# Patient Record
Sex: Male | Born: 1990 | Race: Black or African American | Hispanic: No | Marital: Single | Smoking: Never smoker
Health system: Southern US, Community
[De-identification: ages and names within clinical notes are randomized; demographics above are authoritative.]

---

## 2012-12-01 ENCOUNTER — Ambulatory Visit: Payer: BC Managed Care – PPO

## 2012-12-01 ENCOUNTER — Ambulatory Visit: Payer: BC Managed Care – PPO | Admitting: Family Medicine

## 2012-12-01 VITALS — BP 111/70 | HR 74 | Temp 98.0°F | Resp 16 | Ht 71.5 in | Wt 153.0 lb

## 2012-12-01 DIAGNOSIS — M25532 Pain in left wrist: Secondary | ICD-10-CM

## 2012-12-01 DIAGNOSIS — S63502A Unspecified sprain of left wrist, initial encounter: Secondary | ICD-10-CM

## 2012-12-01 DIAGNOSIS — M25539 Pain in unspecified wrist: Secondary | ICD-10-CM

## 2012-12-01 DIAGNOSIS — R369 Urethral discharge, unspecified: Secondary | ICD-10-CM

## 2012-12-01 DIAGNOSIS — S63509A Unspecified sprain of unspecified wrist, initial encounter: Secondary | ICD-10-CM

## 2012-12-01 LAB — POCT UA - MICROSCOPIC ONLY
Bacteria, U Microscopic: NEGATIVE
Epithelial cells, urine per micros: NEGATIVE
Mucus, UA: NEGATIVE
Yeast, UA: NEGATIVE

## 2012-12-01 LAB — POCT URINALYSIS DIPSTICK
Bilirubin, UA: NEGATIVE
Blood, UA: NEGATIVE
Glucose, UA: NEGATIVE
Ketones, UA: NEGATIVE
Spec Grav, UA: 1.02
pH, UA: 6.5

## 2012-12-01 NOTE — Progress Notes (Signed)
[address]   [place], [place]  [address]   [phone]  Subjective:    Patient ID: Donald Brewer, male    DOB: [birth year], 22 y.o.   MRN: [id number]  HPI This 22 y.o. male presents for evaluation of the following:  1.  L wrist pain: sprained wrist four weeks ago.  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  Swelling in wrist initially; icing; then exercises.  Unable to do xray.  [place] wrist; pain midline.  No n/t/w.    2.  Penile discharge: discharge in urine. Onset one week ago.   No dysuria, +urgency; no hematuria.  No testicular swelling.  +sexual activity; no STD hx; last STD screening several 2 years ago.  Total partners = 11.  No nausea or vomiting. No new partners; same partner x 1 years.  PCP: none  Graduated from [employer]; started working in [place] for [employer].   Review of Systems  Constitutional: Negative for fever, chills, diaphoresis and fatigue.  Gastrointestinal: Negative for nausea, vomiting and abdominal pain.  Genitourinary: Positive for frequency and discharge. Negative for urgency, flank pain, penile swelling, scrotal swelling, genital sores, penile pain and testicular pain.  Musculoskeletal: Positive for myalgias and arthralgias. Negative for joint swelling.  Skin: Negative for wound.  Neurological: Negative for weakness and numbness.    History reviewed. No pertinent past medical history.  History reviewed. No pertinent past surgical history.  Prior to Admission medications   Not on File    No Known Allergies  History   Social History  . Marital Status: Single    Spouse Name: N/A    Number of Children: N/A  . Years of Education: N/A   Occupational History  . Not on file.   Social History Main Topics  . Smoking status: Never Smoker   . Smokeless tobacco: Not on file  . Alcohol Use: No  . Drug Use: No  . Sexually Active: Not on file   Other  Topics Concern  . Not on file   Social History Narrative  . No narrative on file    History reviewed. No pertinent family history.     Objective:   Physical Exam  Nursing note and vitals reviewed. Constitutional: He is oriented to person, place, and time. He appears well-developed and well-nourished. No distress.  Abdominal: Soft. Bowel sounds are normal. There is no tenderness. There is no rebound and no guarding.  Genitourinary: Testes normal and penis normal.  Musculoskeletal:       Left wrist: He exhibits decreased range of motion. He exhibits no tenderness, no bony tenderness and no swelling.       Left hand: Normal.  L wrist: decreased flexion and extension; normal supination and pronation; non-tender to palpation; no swelling.  Non-tender along forearm.  Neurological: He is alert and oriented to person, place, and time.  Skin: He is not diaphoretic.  Psychiatric: He has a normal mood and affect. His behavior is normal. Judgment and thought content normal.      [facility] reading (PRIMARY) by  Dr. Katrinka Blazing. . L WRIST:  NAD.   Results for orders placed in visit on 12/01/12  POCT UA - MICROSCOPIC ONLY      Result Value Range   WBC, Ur, HPF, POC 1-2     RBC, urine, microscopic 1-2     Bacteria, U Microscopic neg     Mucus, UA neg  Epithelial cells, urine per micros neg     Crystals, Ur, HPF, POC neg     Casts, Ur, LPF, POC neg     Yeast, UA neg    POCT URINALYSIS DIPSTICK      Result Value Range   Color, UA yellow     Clarity, UA clear     Glucose, UA neg     Bilirubin, UA neg     Ketones, UA neg     Spec Grav, UA 1.020     Blood, UA neg     pH, UA 6.5     Protein, UA neg     Urobilinogen, UA 1.0     Nitrite, UA neg     Leukocytes, UA Trace      Assessment & Plan:  Penile discharge - Plan: POCT UA - Microscopic Only, POCT urinalysis dipstick, GC/Chlamydia Probe Amp  Pain, wrist joint, left - Plan: DG Wrist Complete Left  1. Penile discharge:  New.  Obtain  GC/Chlam; pt declined empiric treatment with Rocephin and Doxycycline; prefers to wait on labs. 2.  L wrist pain/strain: New.  Recommend rest, ice, avoid lifting for 3-4 weeks; continue home exercise program; if no improvement in 2-4 weeks, call for ortho referral.    No orders of the defined types were placed in this encounter.

## 2012-12-01 NOTE — Patient Instructions (Addendum)
1. Take Aleve one tablet twice daily for inflammation. 2.  Perform wrist exercises 3 sets of ten daily for two weeks. 3.  If no improvement in two weeks, call for referral to physical therapy.

## 2012-12-03 LAB — GC/CHLAMYDIA PROBE AMP: GC Probe RNA: NEGATIVE

## 2014-11-02 ENCOUNTER — Ambulatory Visit (INDEPENDENT_AMBULATORY_CARE_PROVIDER_SITE_OTHER): Payer: BLUE CROSS/BLUE SHIELD | Admitting: Physician Assistant

## 2014-11-02 ENCOUNTER — Telehealth: Payer: Self-pay

## 2014-11-02 ENCOUNTER — Other Ambulatory Visit: Payer: Self-pay

## 2014-11-02 VITALS — BP 110/70 | HR 80 | Temp 98.5°F | Resp 16 | Ht 71.5 in | Wt 155.2 lb

## 2014-11-02 DIAGNOSIS — L282 Other prurigo: Secondary | ICD-10-CM | POA: Diagnosis not present

## 2014-11-02 DIAGNOSIS — R21 Rash and other nonspecific skin eruption: Secondary | ICD-10-CM | POA: Diagnosis not present

## 2014-11-02 LAB — POCT SKIN KOH: Skin KOH, POC: NEGATIVE

## 2014-11-02 MED ORDER — HYDROXYZINE HCL 25 MG PO TABS: 12.5000 mg | ORAL_TABLET | Freq: Three times a day (TID) | ORAL | Status: AC | PRN

## 2014-11-02 MED ORDER — HYDROXYZINE HCL 25 MG PO TABS: 12.5000 mg | ORAL_TABLET | Freq: Three times a day (TID) | ORAL | Status: DC | PRN

## 2014-11-02 MED ORDER — CLOTRIMAZOLE-BETAMETHASONE 1-0.05 % EX CREA: 1.0000 "application " | TOPICAL_CREAM | Freq: Two times a day (BID) | CUTANEOUS | Status: AC

## 2014-11-02 MED ORDER — METHYLPREDNISOLONE ACETATE 80 MG/ML IJ SUSP
120.0000 mg | Freq: Once | INTRAMUSCULAR | Status: AC
Start: 2014-11-02 — End: 2014-11-02
  Administered 2014-11-02: 120 mg via INTRAMUSCULAR

## 2014-11-02 MED ORDER — CLOTRIMAZOLE-BETAMETHASONE 1-0.05 % EX CREA: 1.0000 "application " | TOPICAL_CREAM | Freq: Two times a day (BID) | CUTANEOUS | Status: DC

## 2014-11-02 NOTE — Progress Notes (Signed)
[facility] [address], [place] [id number] [phone]  Date:  11/02/2014   Name:  Ara Kussmaularon C Wickham   DOB:  12/29/1990   MRN:  [id number]  PCP:  No PCP Per Patient    History of Present Illness:  Ara Kussmaularon C Zeman is a 24 y.o. male patient who [facility].  This started at left thigh with three round non-raised lesions.  They were pruritic at first.  He applied hydrocortisone daily, and they have sensed stopped itching.  There was no erythema or swelling.  He then noticed 4 days ago, two dots along his left flexor of elbow that were pruritic.  This followed some erythematous rash along both flexors.  He denies any fever, chills, body aches, mucosal swelling, or dyspnea.  He states that two weeks ago, prior to the onset of the rash, he was mowing the lawn.  He has no known contact with poison ivy, insects including ticks.  He has never had a similar outbreak with insect bites.  He has not changed hygeine products, worn new clothes, or been around sick contacts.  He frequents the gym, but does not use equipment where flexors have direct contact.  No hx of allergies, asthma, or eczema.  Sister has eczema.     There are no active problems to display for this patient.   History reviewed. No pertinent past medical history.  History reviewed. No pertinent past surgical history.  History  Substance Use Topics  . Smoking status: Never Smoker   . Smokeless tobacco: Not on file  . Alcohol Use: No    History reviewed. No pertinent family history.  No Known Allergies  Medication list has been reviewed and updated.  No current outpatient prescriptions on file prior to visit.   No current facility-administered medications on file prior to visit.    ROS ROS otherwise unremarkable unless listed above.   Physical Examination: BP 110/70 mmHg  Pulse 80  Temp(Src) 98.5 F (36.9 C) (Oral)  Resp 16  Ht 5' 11.5" (1.816 m)  Wt 155 lb 3.2 oz (70.398  kg)  BMI 21.35 kg/m2  SpO2 94% Ideal Body Weight: Weight in (lb) to have BMI = 25: 181.4  Physical Exam  Constitutional: He is oriented to person, place, and time. He appears well-developed and well-nourished. No distress.  HENT:  Head: Normocephalic and atraumatic.  Eyes: Pupils are equal, round, and reactive to light.  Cardiovascular: Normal rate.   Pulmonary/Chest: Effort normal and breath sounds normal. No respiratory distress.  Neurological: He is alert and oriented to person, place, and time.  Skin: Skin is warm and dry. Rash noted.  Flexor arms: Small papular/pustular rash along flexor regions.  Hyperpigmented circular rash along the lateral flexor of left elbow.   Thigh: Left lateral thigh with large hyperpigmented annular lesions.  They are not raised, but dry appearing and shiny. Neck: Hyperpigmented plaques along right side of neck with some minor scaling.    Psychiatric: He has a normal mood and affect. His behavior is normal.    [place] with yellowing fluorescence along flexor rash.     Assessment and Plan: 24 year old male is here today for chief complaint of rash.  Diff dx runs the gamete at this time--fungal, irritant, viral, eczema/combination.  I am going to treat as both a duel fungal/inflammatory irritant condition.  Treated with 120mg  of depo-medrol.  Given lotrisone to apply twice daily.  Given verbal  and written alarming instruction that warrant immediate return.  He will return if no improvement in 1 week.    Rash and nonspecific skin eruption - Plan: POCT Skin KOH, methylPREDNISolone acetate (DEPO-MEDROL) injection 120 mg, clotrimazole-betamethasone (LOTRISONE) cream, hydrOXYzine (ATARAX/VISTARIL) 25 MG tablet, DISCONTINUED: clotrimazole-betamethasone (LOTRISONE) cream, DISCONTINUED: hydrOXYzine (ATARAX/VISTARIL) 25 MG tablet  Pruritic rash - Plan: hydrOXYzine (ATARAX/VISTARIL) 25 MG tablet, DISCONTINUED: hydrOXYzine (ATARAX/VISTARIL) 25 MG tablet  Trena Platt, PA-C [facility] 5/31/20165:41 PM

## 2014-11-02 NOTE — Telephone Encounter (Signed)
Patient was here today and was prescribed "hydroxyzine" and "clotrimazole-Betamethasone". [facility] [address] and they are closed today. I advise patient to contact [facility] on [address] to see if they could pull it from their system. Patient to call back if they are unable to and have us call it in. Patients call back number is [phone]

## 2014-11-02 NOTE — Patient Instructions (Signed)

## 2014-11-03 NOTE — Telephone Encounter (Signed)
Called pt X 3. Busy signal.

## 2014-11-14 ENCOUNTER — Telehealth: Payer: Self-pay | Admitting: Physician Assistant

## 2014-11-14 NOTE — Telephone Encounter (Signed)
Contacted patient: Patient is doing well.  He states that the rash is virtually gone.  He has no pruritus at this time.

## 2014-12-20 IMAGING — CR DG WRIST COMPLETE 3+V*L*
2 series · 2 of 2 positions shown · non-contrast
Comparison: None.

CLINICAL DATA: Wrist pain after fall.

LEFT WRIST - COMPLETE 3+ VIEW

[PA]
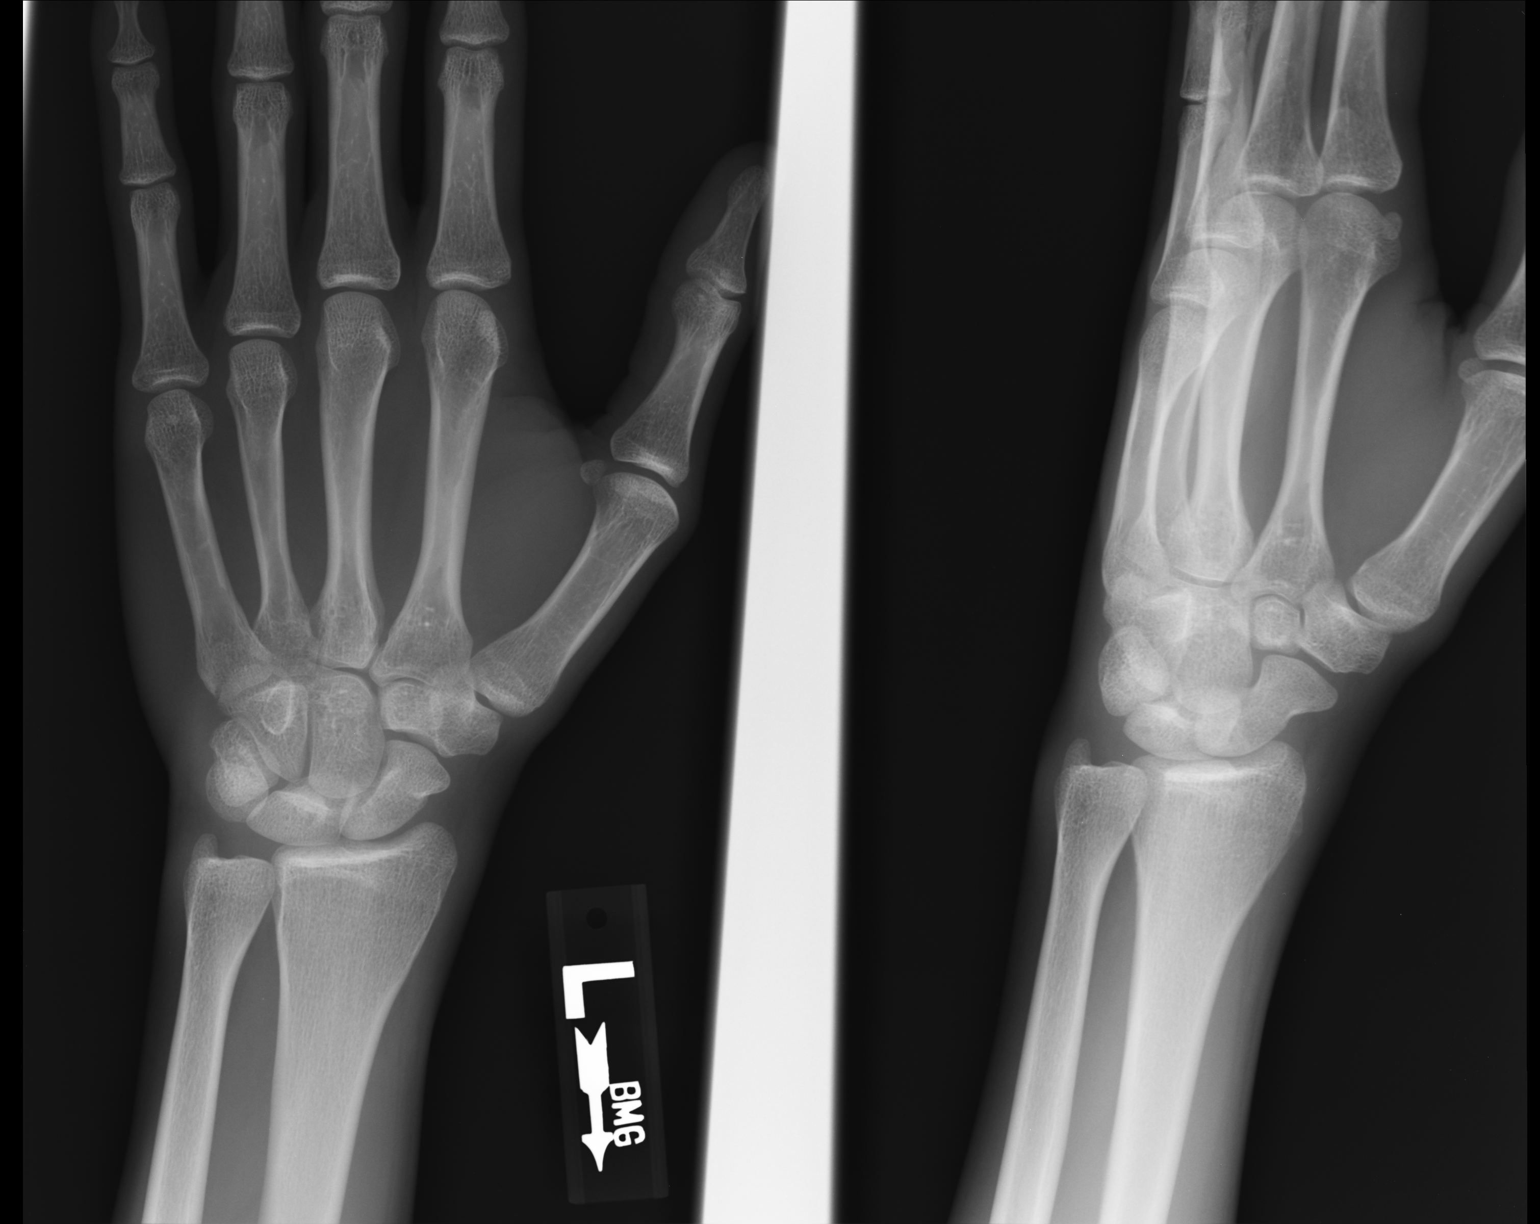

[lateral]
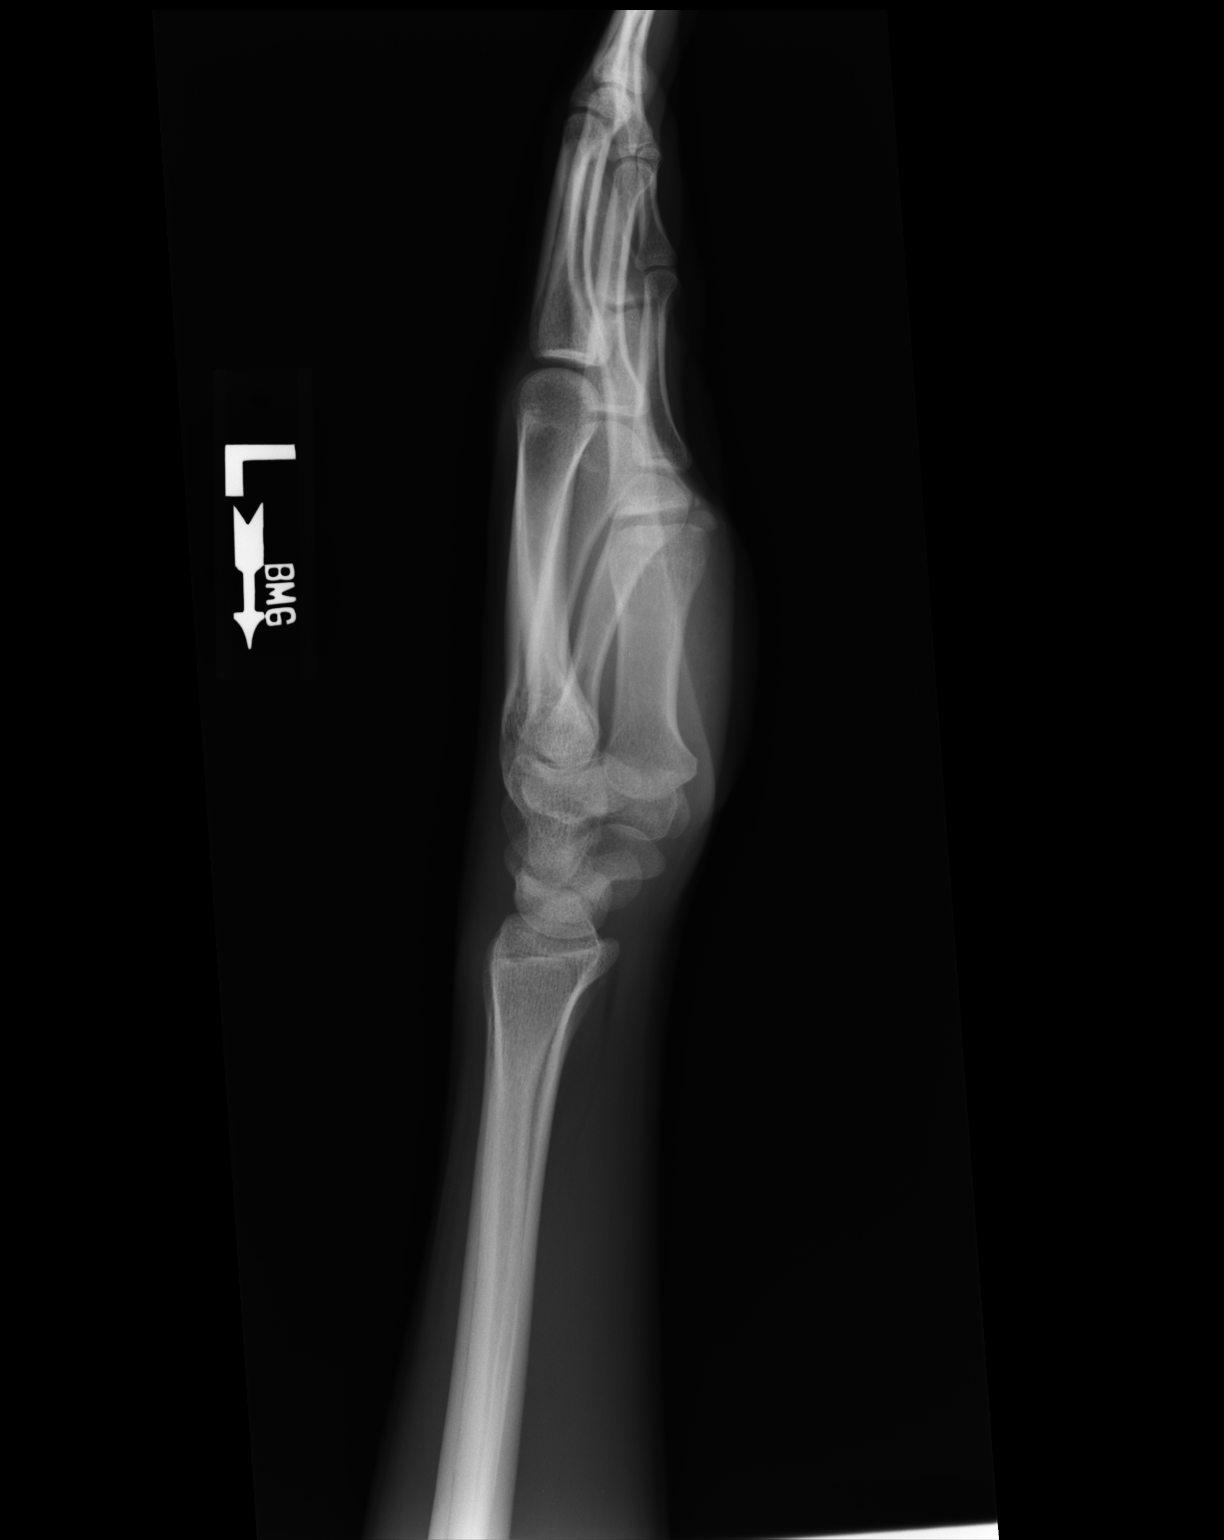

[2 of 2 positions shown; findings below may reference images not displayed]

FINDINGS: Imaged bones, joints and soft tissues appear normal.
IMPRESSION: Negative exam.

Clinically significant discrepancy from primary report, if
provided: None

## 2016-06-06 ENCOUNTER — Encounter: Payer: BLUE CROSS/BLUE SHIELD | Admitting: Physician Assistant

## 2016-06-08 ENCOUNTER — Ambulatory Visit: Payer: BLUE CROSS/BLUE SHIELD

## 2016-08-16 NOTE — Progress Notes (Signed)
This encounter was created in error - please disregard.

## 2017-12-31 ENCOUNTER — Ambulatory Visit: Payer: Self-pay | Admitting: Family Medicine

## 2022-06-29 DIAGNOSIS — Z0000 Encounter for general adult medical examination without abnormal findings: Secondary | ICD-10-CM | POA: Diagnosis not present

## 2022-06-29 DIAGNOSIS — E7800 Pure hypercholesterolemia, unspecified: Secondary | ICD-10-CM | POA: Diagnosis not present
